# Patient Record
Sex: Male | Born: 1970 | Race: Black or African American | Hispanic: No | Marital: Single | Smoking: Never smoker
Health system: Southern US, Community
[De-identification: ages and names within clinical notes are randomized; demographics above are authoritative.]

## PROBLEM LIST (undated history)

## (undated) DIAGNOSIS — S060XAA Concussion with loss of consciousness status unknown, initial encounter: Secondary | ICD-10-CM

## (undated) DIAGNOSIS — S060X9A Concussion with loss of consciousness of unspecified duration, initial encounter: Secondary | ICD-10-CM

## (undated) DIAGNOSIS — I10 Essential (primary) hypertension: Secondary | ICD-10-CM

## (undated) HISTORY — PX: FINGER SURGERY: SHX640

---

## 2015-12-16 ENCOUNTER — Emergency Department
Admission: EM | Admit: 2015-12-16 | Discharge: 2015-12-16 | Disposition: A | Discharge status: Home / Self Care | Payer: Self-pay | Attending: Student | Admitting: Student

## 2015-12-16 ENCOUNTER — Encounter: Payer: Self-pay | Admitting: Emergency Medicine

## 2015-12-16 ENCOUNTER — Emergency Department: Payer: Self-pay

## 2015-12-16 DIAGNOSIS — I10 Essential (primary) hypertension: Secondary | ICD-10-CM | POA: Insufficient documentation

## 2015-12-16 DIAGNOSIS — S0292XA Unspecified fracture of facial bones, initial encounter for closed fracture: Secondary | ICD-10-CM

## 2015-12-16 DIAGNOSIS — S0232XA Fracture of orbital floor, left side, initial encounter for closed fracture: Secondary | ICD-10-CM | POA: Insufficient documentation

## 2015-12-16 DIAGNOSIS — Y999 Unspecified external cause status: Secondary | ICD-10-CM | POA: Insufficient documentation

## 2015-12-16 DIAGNOSIS — Y929 Unspecified place or not applicable: Secondary | ICD-10-CM | POA: Insufficient documentation

## 2015-12-16 DIAGNOSIS — Y939 Activity, unspecified: Secondary | ICD-10-CM | POA: Insufficient documentation

## 2015-12-16 DIAGNOSIS — S0240FA Zygomatic fracture, left side, initial encounter for closed fracture: Secondary | ICD-10-CM | POA: Insufficient documentation

## 2015-12-16 DIAGNOSIS — S0990XA Unspecified injury of head, initial encounter: Secondary | ICD-10-CM

## 2015-12-16 DIAGNOSIS — S0240DA Maxillary fracture, left side, initial encounter for closed fracture: Secondary | ICD-10-CM | POA: Insufficient documentation

## 2015-12-16 DIAGNOSIS — T1490XA Injury, unspecified, initial encounter: Secondary | ICD-10-CM

## 2015-12-16 HISTORY — DX: Essential (primary) hypertension: I10

## 2015-12-16 HISTORY — DX: Concussion with loss of consciousness status unknown, initial encounter: S06.0XAA

## 2015-12-16 HISTORY — DX: Concussion with loss of consciousness of unspecified duration, initial encounter: S06.0X9A

## 2015-12-16 LAB — COMPREHENSIVE METABOLIC PANEL
ALBUMIN: 4.7 g/dL (ref 3.5–5.0)
ALK PHOS: 62 U/L (ref 38–126)
ALT: 19 U/L (ref 17–63)
ANION GAP: 7 (ref 5–15)
AST: 34 U/L (ref 15–41)
BUN: 17 mg/dL (ref 6–20)
CALCIUM: 9.7 mg/dL (ref 8.9–10.3)
CO2: 28 mmol/L (ref 22–32)
Chloride: 104 mmol/L (ref 101–111)
Creatinine, Ser: 1.31 mg/dL — ABNORMAL HIGH (ref 0.61–1.24)
GFR calc Af Amer: >60 mL/min (ref >60)
GFR calc non Af Amer: >60 mL/min (ref >60)
GLUCOSE: 138 mg/dL — AB (ref 65–99)
Potassium: 3.5 mmol/L (ref 3.5–5.1)
SODIUM: 139 mmol/L (ref 135–145)
Total Bilirubin: 1.2 mg/dL (ref 0.3–1.2)
Total Protein: 7.8 g/dL (ref 6.5–8.1)

## 2015-12-16 LAB — CBC WITH DIFFERENTIAL/PLATELET
BASOS ABS: 0.1 10*3/uL (ref 0–0.1)
BASOS PCT: 0 %
EOS ABS: 0.1 10*3/uL (ref 0–0.7)
Eosinophils Relative: 1 %
HEMATOCRIT: 45 % (ref 40.0–52.0)
HEMOGLOBIN: 14.8 g/dL (ref 13.0–18.0)
Lymphocytes Relative: 7 %
Lymphs Abs: 1.9 10*3/uL (ref 1.0–3.6)
MCH: 29.5 pg (ref 26.0–34.0)
MCHC: 32.8 g/dL (ref 32.0–36.0)
MCV: 89.9 fL (ref 80.0–100.0)
Monocytes Absolute: 2 10*3/uL — ABNORMAL HIGH (ref 0.2–1.0)
Monocytes Relative: 8 %
NEUTROS ABS: 22.6 10*3/uL — AB (ref 1.4–6.5)
NEUTROS PCT: 84 %
Platelets: 175 10*3/uL (ref 150–440)
RBC: 5.01 MIL/uL (ref 4.40–5.90)
RDW: 13.5 % (ref 11.5–14.5)
WBC: 26.7 10*3/uL — AB (ref 3.8–10.6)

## 2015-12-16 LAB — PROTIME-INR
INR: 1.14
Prothrombin Time: 14.8 seconds (ref 11.4–15.0)

## 2015-12-16 LAB — APTT: APTT: 24 s (ref 24–36)

## 2015-12-16 LAB — LIPASE, BLOOD: Lipase: 20 U/L (ref 11–51)

## 2015-12-16 MED ORDER — ONDANSETRON HCL 4 MG/2ML IJ SOLN
INTRAMUSCULAR | Status: AC
  Filled 2015-12-16: qty 2

## 2015-12-16 MED ORDER — ONDANSETRON HCL 4 MG/2ML IJ SOLN
4.0000 mg | Freq: Once | INTRAMUSCULAR | Status: AC
  Administered 2015-12-16: 4 mg via INTRAVENOUS
  Filled 2015-12-16: qty 2

## 2015-12-16 MED ORDER — ONDANSETRON HCL 4 MG/2ML IJ SOLN
4.0000 mg | Freq: Once | INTRAMUSCULAR | Status: AC
  Administered 2015-12-16: 4 mg via INTRAVENOUS

## 2015-12-16 MED ORDER — OXYCODONE HCL 5 MG PO TABS: 5.0000 mg | ORAL_TABLET | Freq: Four times a day (QID) | ORAL | Status: DC | PRN

## 2015-12-16 MED ORDER — MORPHINE SULFATE (PF) 4 MG/ML IV SOLN
4.0000 mg | Freq: Once | INTRAVENOUS | Status: AC
  Administered 2015-12-16: 4 mg via INTRAVENOUS
  Filled 2015-12-16: qty 1

## 2015-12-16 MED ORDER — ONDANSETRON 4 MG PO TBDP
4.0000 mg | ORAL_TABLET | Freq: Three times a day (TID) | ORAL | Status: AC | PRN
Start: 2015-12-16 — End: ?

## 2015-12-16 MED ORDER — SODIUM CHLORIDE 0.9 % IV BOLUS (SEPSIS)
1000.0000 mL | Freq: Once | INTRAVENOUS | Status: AC
  Administered 2015-12-16: 1000 mL via INTRAVENOUS

## 2015-12-16 NOTE — ED Notes (Signed)
Discharge instructions reviewed with patient. Patient verbalized understanding. Patient ambulated to lobby without difficulty.   

## 2015-12-16 NOTE — ED Notes (Signed)
Pt standing up next to stretcher. Explained to pt that he needed to use call bell if he needed to get up and to not get up without assistance. Pt verbalized understanding. Pt alert and oriented times 4 at this time.

## 2015-12-16 NOTE — ED Notes (Signed)
Went in to ask pt what he would like to drink for PO challenge. Pt stated, "Nothing, I'm not thirsty." Explained to pt that we needed to give him something to drink to make sure that he would be able to keep it down and not vomit it back up so that we could send him home. Pt stated, "I don't want to go home. I need to stay at least one night here." Provider notified and to bedside to talk with patient and family.
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
## 2015-12-16 NOTE — ED Notes (Signed)
Patient transported to CT 
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
## 2015-12-16 NOTE — ED Notes (Signed)
Pt advised to ask for assistance before getting up. Pt crawled out of bed stating he had to have a bowel movement. Pt noted with unsteady gait. Pt vomited in toilet. Pt assisted back to bed and Dr. Inocencio HomesGayle at bedside.

## 2019-09-20 ENCOUNTER — Emergency Department: Payer: Self-pay

## 2019-09-20 ENCOUNTER — Other Ambulatory Visit: Payer: Self-pay

## 2019-09-20 ENCOUNTER — Encounter: Payer: Self-pay | Admitting: *Deleted

## 2019-09-20 DIAGNOSIS — F1210 Cannabis abuse, uncomplicated: Secondary | ICD-10-CM | POA: Insufficient documentation

## 2019-09-20 DIAGNOSIS — I10 Essential (primary) hypertension: Secondary | ICD-10-CM | POA: Insufficient documentation

## 2019-09-20 DIAGNOSIS — R109 Unspecified abdominal pain: Secondary | ICD-10-CM | POA: Insufficient documentation

## 2019-09-20 LAB — URINALYSIS, COMPLETE (UACMP) WITH MICROSCOPIC
Bacteria, UA: NONE SEEN
Bilirubin Urine: NEGATIVE
Glucose, UA: NEGATIVE mg/dL
Ketones, ur: NEGATIVE mg/dL
Leukocytes,Ua: NEGATIVE
Nitrite: NEGATIVE
Protein, ur: NEGATIVE mg/dL
Specific Gravity, Urine: 1.038 — ABNORMAL HIGH (ref 1.005–1.030)
Squamous Epithelial / LPF: NONE SEEN (ref 0–5)
pH: 5 (ref 5.0–8.0)

## 2019-09-20 NOTE — ED Triage Notes (Signed)
Pt presents w/ c/o flank pain. Pt states tingling near spine and pain that developed over the course of 3 days. Pt states pain was getting progressively worse. Pt took a couple of muscle relaxing meds, vomiting afterwards. Pt has been taking tylenol 650 mg for pain, last taken at 1600 today.

## 2019-09-21 ENCOUNTER — Emergency Department
Admission: EM | Admit: 2019-09-21 | Discharge: 2019-09-21 | Disposition: A | Discharge status: Home / Self Care | Payer: Self-pay | Attending: Emergency Medicine | Admitting: Emergency Medicine

## 2019-09-21 DIAGNOSIS — R109 Unspecified abdominal pain: Secondary | ICD-10-CM

## 2019-09-21 LAB — CBC WITH DIFFERENTIAL/PLATELET
Abs Immature Granulocytes: 0.01 10*3/uL (ref 0.00–0.07)
Basophils Absolute: 0.1 10*3/uL (ref 0.0–0.1)
Basophils Relative: 1 %
Eosinophils Absolute: 0.5 10*3/uL (ref 0.0–0.5)
Eosinophils Relative: 7 %
HCT: 44.9 % (ref 39.0–52.0)
Hemoglobin: 14.7 g/dL (ref 13.0–17.0)
Immature Granulocytes: 0 %
Lymphocytes Relative: 52 %
Lymphs Abs: 3.8 10*3/uL (ref 0.7–4.0)
MCH: 30.6 pg (ref 26.0–34.0)
MCHC: 32.7 g/dL (ref 30.0–36.0)
MCV: 93.5 fL (ref 80.0–100.0)
Monocytes Absolute: 0.6 10*3/uL (ref 0.1–1.0)
Monocytes Relative: 8 %
Neutro Abs: 2.3 10*3/uL (ref 1.7–7.7)
Neutrophils Relative %: 32 %
Platelets: 174 10*3/uL (ref 150–400)
RBC: 4.8 MIL/uL (ref 4.22–5.81)
RDW: 12.1 % (ref 11.5–15.5)
WBC: 7.2 10*3/uL (ref 4.0–10.5)
nRBC: 0 % (ref 0.0–0.2)

## 2019-09-21 LAB — COMPREHENSIVE METABOLIC PANEL
ALT: 18 U/L (ref 0–44)
AST: 31 U/L (ref 15–41)
Albumin: 4.3 g/dL (ref 3.5–5.0)
Alkaline Phosphatase: 59 U/L (ref 38–126)
Anion gap: 10 (ref 5–15)
BUN: 13 mg/dL (ref 6–20)
CO2: 22 mmol/L (ref 22–32)
Calcium: 9.5 mg/dL (ref 8.9–10.3)
Chloride: 107 mmol/L (ref 98–111)
Creatinine, Ser: 1.06 mg/dL (ref 0.61–1.24)
GFR calc Af Amer: >60 mL/min (ref >60)
GFR calc non Af Amer: >60 mL/min (ref >60)
Glucose, Bld: 82 mg/dL (ref 70–99)
Potassium: 4.4 mmol/L (ref 3.5–5.1)
Sodium: 139 mmol/L (ref 135–145)
Total Bilirubin: 1 mg/dL (ref 0.3–1.2)
Total Protein: 7.2 g/dL (ref 6.5–8.1)

## 2019-09-21 LAB — LIPASE, BLOOD: Lipase: 29 U/L (ref 11–51)

## 2019-09-21 MED ORDER — KETOROLAC TROMETHAMINE 30 MG/ML IJ SOLN
10.0000 mg | Freq: Once | INTRAMUSCULAR | Status: AC
  Administered 2019-09-21: 9.9 mg via INTRAVENOUS
  Filled 2019-09-21: qty 1

## 2019-09-21 MED ORDER — OXYCODONE-ACETAMINOPHEN 5-325 MG PO TABS: 1.0000 | ORAL_TABLET | ORAL | 0 refills | Status: AC | PRN

## 2019-09-21 MED ORDER — ACETAMINOPHEN 325 MG PO TABS
650.0000 mg | ORAL_TABLET | Freq: Once | ORAL | Status: AC
  Administered 2019-09-21: 650 mg via ORAL
  Filled 2019-09-21: qty 2

## 2019-09-21 MED ORDER — SODIUM CHLORIDE 0.9 % IV BOLUS
1000.0000 mL | Freq: Once | INTRAVENOUS | Status: AC
  Administered 2019-09-21: 1000 mL via INTRAVENOUS

## 2019-09-21 MED ORDER — OXYCODONE-ACETAMINOPHEN 5-325 MG PO TABS
1.0000 | ORAL_TABLET | Freq: Once | ORAL | Status: AC
  Administered 2019-09-21: 03:00:00 1 via ORAL
  Filled 2019-09-21: qty 1

## 2019-09-21 NOTE — Discharge Instructions (Addendum)
1.  You may continue ibuprofen as needed for pain; Percocet as needed for more severe pain. 2.  Drink plenty of fluids daily. 3.  Return to the ER for worsening symptoms, persistent vomiting, difficulty breathing or other concerns.

## 2019-09-21 NOTE — ED Provider Notes (Signed)
[facility] Emergency Department Provider Note   ____________________________________________   First MD Initiated Contact with Patient 09/21/19 0129     (approximate)  I have reviewed the triage vital signs and the nursing notes.   HISTORY  Chief Complaint Flank Pain    HPI Travis Huffman is a 48 y.o. male who presents to the ED from home with a chief complaint of nontraumatic right flank pain.  Patient reports a 3-day history of pain in his right flank that has now radiated into his right side.  Patient took a friend's muscle relaxer pill x2 and vomited afterwards.  Has been taking Tylenol and ibuprofen for the pain without relief of symptoms.  Denies fever, cough, chest pain, shortness of breath, abdominal pain, hematuria, dysuria.  Denies recent trauma.  Denies exertional workouts.       Past Medical History:  Diagnosis Date  . Concussion   . Hypertension     There are no problems to display for this patient.   Past Surgical History:  Procedure Laterality Date  . FINGER SURGERY      Prior to Admission medications   Medication Sig Start Date End Date Taking? Authorizing Provider  ondansetron (ZOFRAN ODT) 4 MG disintegrating tablet Take 1 tablet (4 mg total) by mouth every 8 (eight) hours as needed for nausea or vomiting. 12/16/15   Joanne Gavel, MD  oxyCODONE (ROXICODONE) 5 MG immediate release tablet Take 1 tablet (5 mg total) by mouth every 6 (six) hours as needed for moderate pain. Do not drive while taking this medication. 12/16/15   Joanne Gavel, MD    Allergies Penicillins  History reviewed. No pertinent family history.  Social History Social History   Tobacco Use  . Smoking status: Never Smoker  . Smokeless tobacco: Never Used  Substance Use Topics  . Alcohol use: Yes    Comment: pt states drinks but does not know how much  . Drug use: Yes    Types: Marijuana    Comment: daily    Review of  Systems  Constitutional: No fever/chills Eyes: No visual changes. ENT: No sore throat. Cardiovascular: Denies chest pain. Respiratory: Denies shortness of breath. Gastrointestinal: Positive for right flank pain.  No abdominal pain.  No nausea, no vomiting.  No diarrhea.  No constipation. Genitourinary: Negative for dysuria. Musculoskeletal: Negative for back pain. Skin: Negative for rash. Neurological: Negative for headaches, focal weakness or numbness.   ____________________________________________   PHYSICAL EXAM:  VITAL SIGNS: ED Triage Vitals  Enc Vitals Group     BP 09/20/19 1944 (!) 148/98     Pulse Rate 09/20/19 1944 68     Resp 09/20/19 1944 17     Temp 09/20/19 1944 98.9 F (37.2 C)     Temp Source 09/20/19 1944 Oral     SpO2 09/20/19 1944 97 %     Weight 09/20/19 1944 170 lb (77.1 kg)     Height 09/20/19 1944 6' (1.829 m)     Head Circumference --      Peak Flow --      Pain Score 09/20/19 1954 0     Pain Loc --      Pain Edu? --      Excl. in [place]? --     Constitutional: Alert and oriented. Well appearing and in no acute distress. Eyes: Conjunctivae are normal. PERRL. EOMI. Head: Atraumatic. Nose: No congestion/rhinnorhea. Mouth/Throat: Mucous membranes are moist.  Oropharynx non-erythematous. Neck: No stridor.  Cardiovascular: Normal rate, regular rhythm. Grossly normal heart sounds.  Good peripheral circulation. Respiratory: Normal respiratory effort.  No retractions. Lungs CTAB. Gastrointestinal: Soft and nontender to light or deep palpation. No distention. No abdominal bruits. No CVA tenderness. Musculoskeletal: No spinal tenderness to palpation.  No lower extremity tenderness nor edema.  No joint effusions. Neurologic:  Normal speech and language. No gross focal neurologic deficits are appreciated. No gait instability. Skin:  Skin is warm, dry and intact. No rash noted.  No vesicles. Psychiatric: Mood and affect are normal. Speech and behavior are  normal.  ____________________________________________   LABS (all labs ordered are listed, but only abnormal results are displayed)  Labs Reviewed  URINALYSIS, COMPLETE (UACMP) WITH MICROSCOPIC - Abnormal; Notable for the following components:      Result Value   Color, Urine YELLOW (*)    APPearance CLEAR (*)    Specific Gravity, Urine 1.038 (*)    Hgb urine dipstick SMALL (*)    All other components within normal limits  CBC WITH DIFFERENTIAL/PLATELET  COMPREHENSIVE METABOLIC PANEL  LIPASE, BLOOD   ____________________________________________  EKG  None ____________________________________________  RADIOLOGY  ED MD interpretation: Tiny nonobstructing right renal calculus  Official radiology report(s): CT RENAL STONE STUDY  Result Date: 09/20/2019 CLINICAL DATA:  Progressive flank pain for 3 days. EXAM: CT ABDOMEN AND PELVIS WITHOUT CONTRAST TECHNIQUE: Multidetector CT imaging of the abdomen and pelvis was performed following the standard protocol without IV contrast. COMPARISON:  None. FINDINGS: Lower chest: Clear lung bases. No significant pleural or pericardial effusion. Hepatobiliary: There is a 10 mm cyst in the left hepatic lobe on image 17/2. No other focal hepatic abnormalities are identified on noncontrast imaging. No evidence of gallstones, gallbladder wall thickening or biliary dilatation. Pancreas: Unremarkable. No pancreatic ductal dilatation or surrounding inflammatory changes. Spleen: Normal in size without focal abnormality. Adrenals/Urinary Tract: Both adrenal glands appear normal. There is a 2 mm nonobstructing calculus in the upper pole of the right kidney, best seen on coronal image 82/5. No other urinary tract calculi are seen. There is no hydronephrosis or perinephric soft tissue stranding. The bladder is nearly empty and suboptimally evaluated. Stomach/Bowel: No evidence of bowel wall thickening, distention or surrounding inflammatory change. The appendix  appears normal. Vascular/Lymphatic: There are no enlarged abdominal or pelvic lymph nodes. Mild aortic atherosclerosis. Reproductive: The prostate gland and seminal vesicles appear normal. Other: No evidence of abdominal wall mass or hernia. No ascites. Musculoskeletal: No acute or significant osseous findings. Mild lower lumbar spondylosis. IMPRESSION: 1. No acute findings or explanation for the patient's symptoms. 2. Tiny nonobstructing right renal calculus. No evidence of ureteral calculus or hydronephrosis. 3. Aortic Atherosclerosis (ICD10-I70.0). Electronically Signed   By: Carey Bullocks M.D.   On: 09/20/2019 20:35    ____________________________________________   PROCEDURES  Procedure(s) performed (including Critical Care):  Procedures   ____________________________________________   INITIAL IMPRESSION / ASSESSMENT AND PLAN / ED COURSE  As part of my medical decision making, I reviewed the following data within the electronic MEDICAL RECORD NUMBER Nursing notes reviewed and incorporated, Labs reviewed, Old chart reviewed, Radiograph reviewed, Notes from prior ED visits and [place] Controlled Substance Database     Travis Huffman was evaluated in Emergency Department on 12/19/2020for the symptoms described in the history of present illness. He was evaluated in the context of the global COVID-19 pandemic, which necessitated consideration that the patient might be at risk for infection with the SARS-CoV-2 virus that causes COVID-19. Institutional protocols and algorithms that  pertain to the evaluation of patients at risk for COVID-19 are in a state of rapid change based on information released by regulatory bodies including the CDC and federal and state organizations. These policies and algorithms were followed during the patient's care in the ED.    48 year old male who presents with a several day history of nontraumatic right flank pain. Differential diagnosis includes, but is not  limited to, acute appendicitis, renal colic, testicular torsion, urinary tract infection/pyelonephritis, prostatitis,  epididymitis, diverticulitis, small bowel obstruction or ileus, colitis, abdominal aortic aneurysm, gastroenteritis, hernia, etc.  Urinalysis revealed microscopic hematuria.  CT renal stone did not demonstrate obvious obstructing ureteral stone.  Will obtain basic lab work, initiate IV fluid hydration, IV Toradol for pain and reassess.   Clinical Course as of Sep 20 253  Sat Sep 21, 2019  [id number] Patient resting in no acute distress.  Updated him on laboratory results.  Likely patient has tiny stone causing his pain.  Will empirically treat with analgesia and patient will follow up with his PCP next week.  Strict return precautions given.  Patient verbalizes understanding agrees with plan of care.   [JS]    Clinical Course User Index [JS] Irean HongSung, Deshia Vanderhoof J, MD     ____________________________________________   FINAL CLINICAL IMPRESSION(S) / ED DIAGNOSES  Final diagnoses:  Right flank pain     ED Discharge Orders    None       Note:  This document was prepared using Dragon voice recognition software and may include unintentional dictation errors.   Irean HongSung, Vallory Oetken J, MD 09/21/19 [phone]

## 2020-06-21 IMAGING — CT CT RENAL STONE PROTOCOL
2 of 4 series · 16 of 46 positions shown, 18 images · non-contrast
Comparison: None.

CLINICAL DATA: Progressive flank pain for 3 days.

EXAM:
CT ABDOMEN AND PELVIS WITHOUT CONTRAST
TECHNIQUE: Multidetector CT imaging of the abdomen and pelvis was performed
following the standard protocol without IV contrast.

[Series 2: stone full standard · axial · 0.74mm/px · z∈[-976,-606]mm · 13 of 82 slices shown, 15 images]
[im 4/82  soft-tissue]
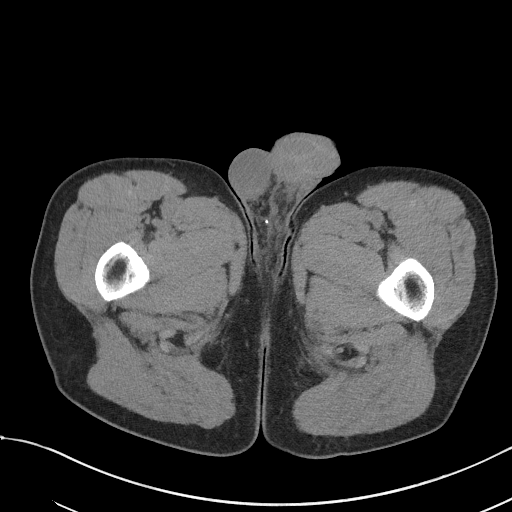
[im 4/82  bone]
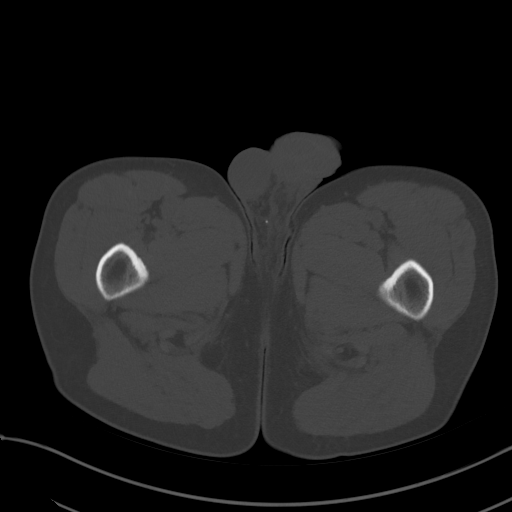
[im 10/82  soft-tissue]
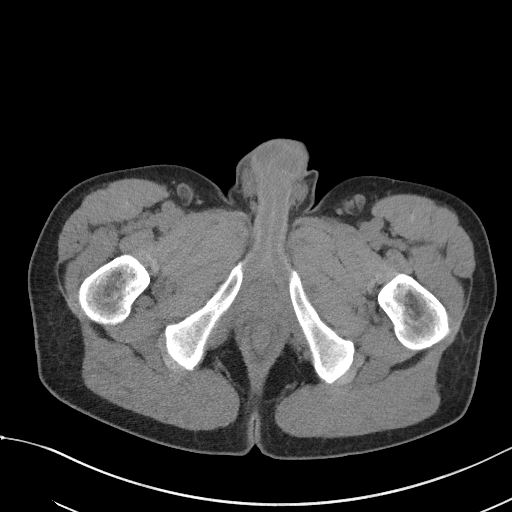
[im 16/82  soft-tissue]
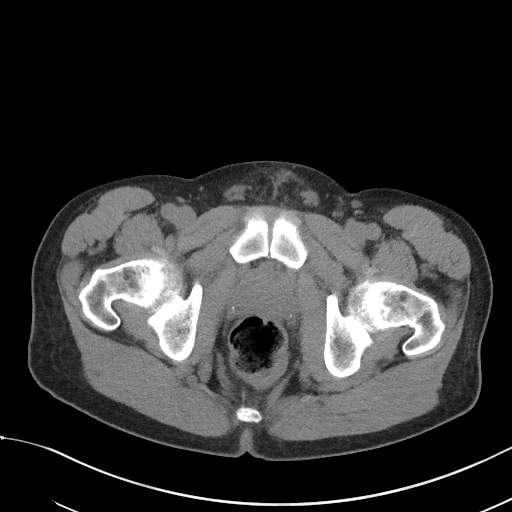
[im 22/82  soft-tissue]
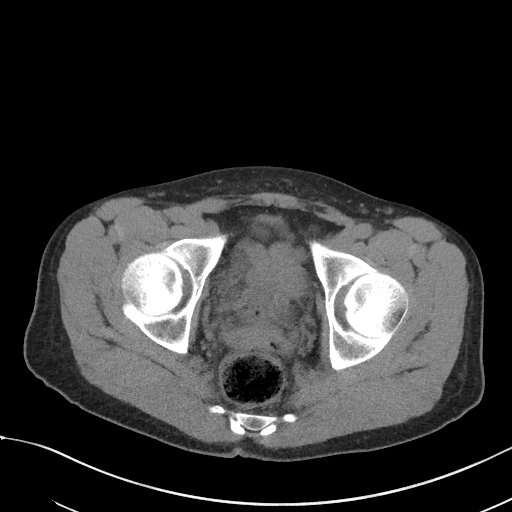
[im 29/82  soft-tissue]
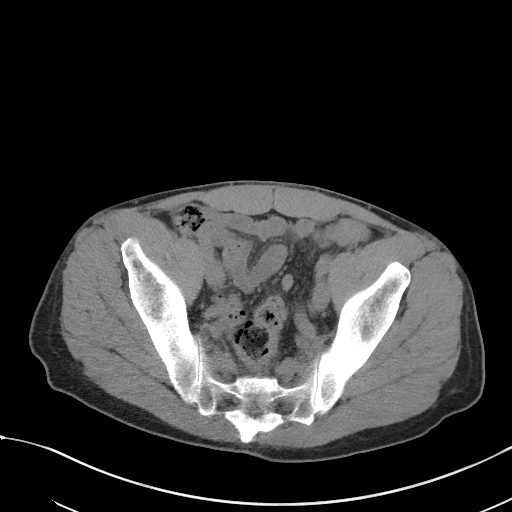
[im 35/82  soft-tissue]
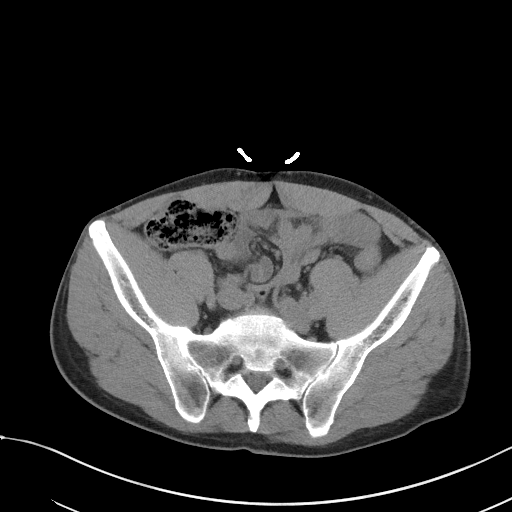
[im 41/82  soft-tissue]
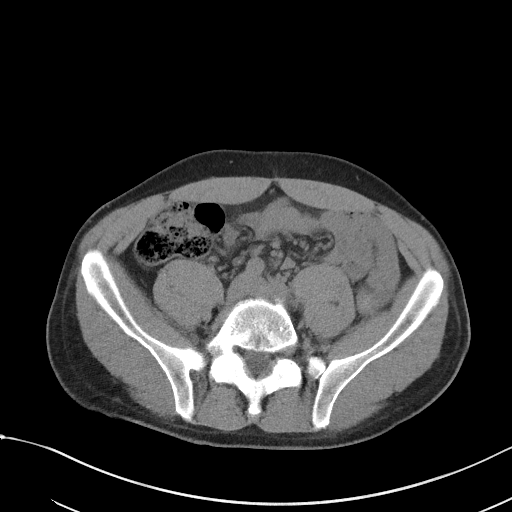
[im 47/82  soft-tissue]
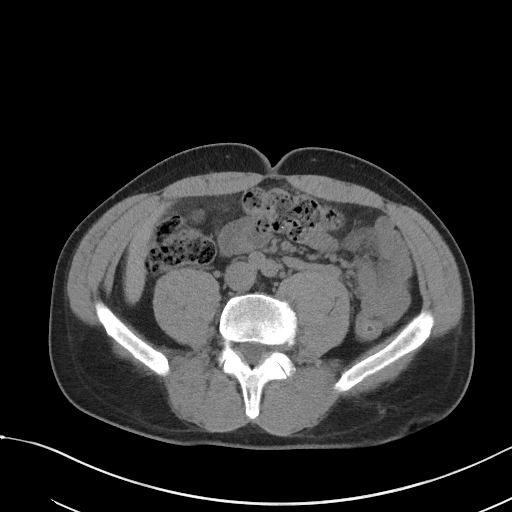
[im 53/82  soft-tissue]
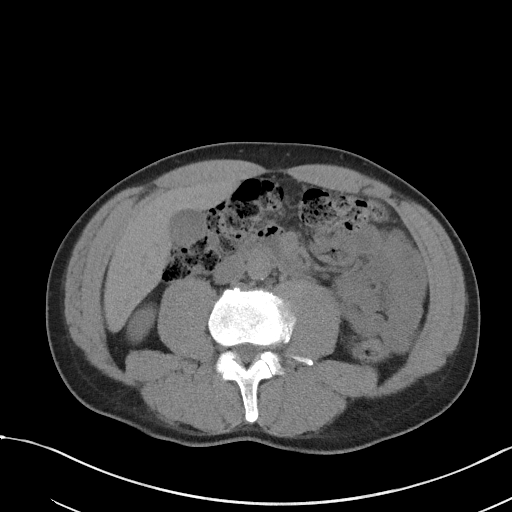
[im 53/82  bone]
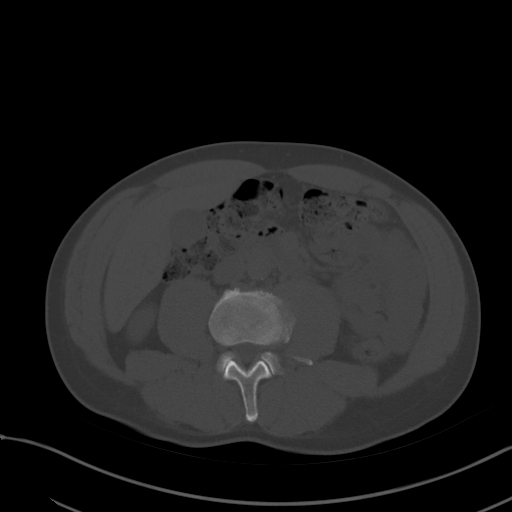
[im 60/82  soft-tissue]
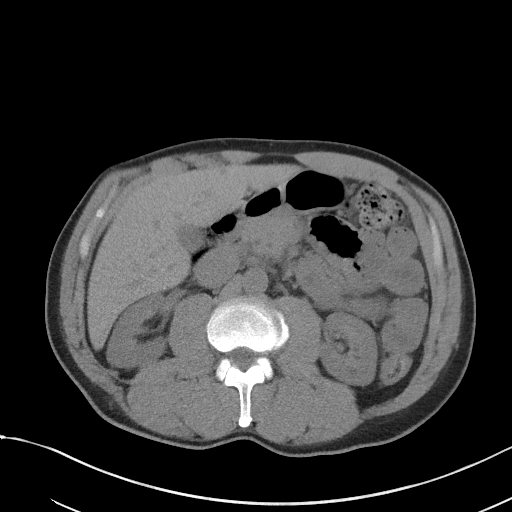
[im 66/82  soft-tissue]
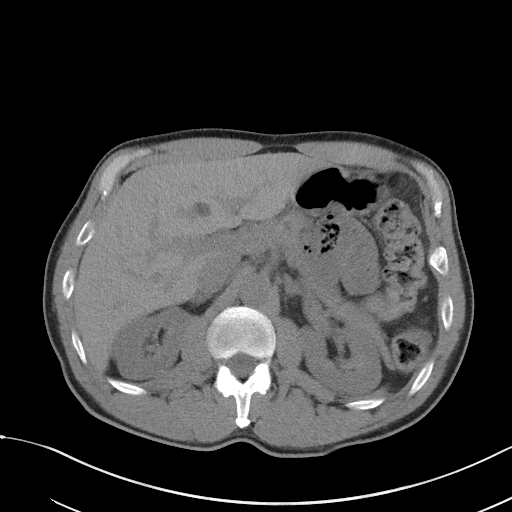
[im 72/82  soft-tissue]
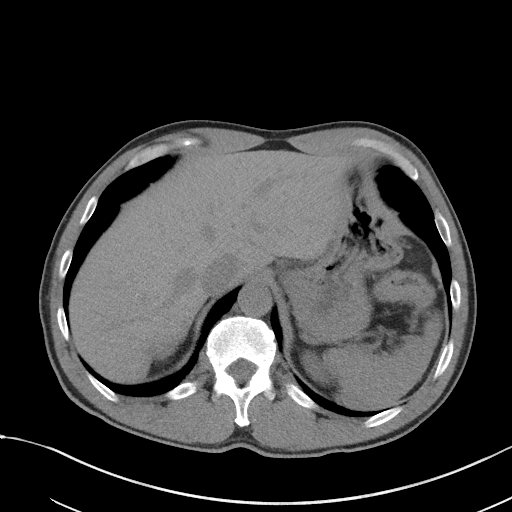
[im 78/82  soft-tissue]
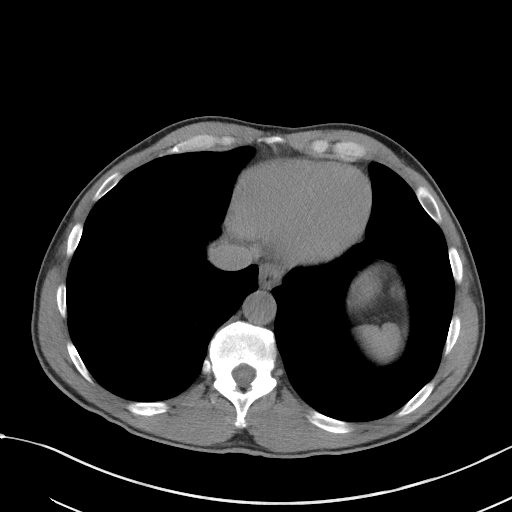

[Series 5: coronal · coronal · 0.70mm/px · 3 of 121 slices shown]
[im 41/121  soft-tissue]
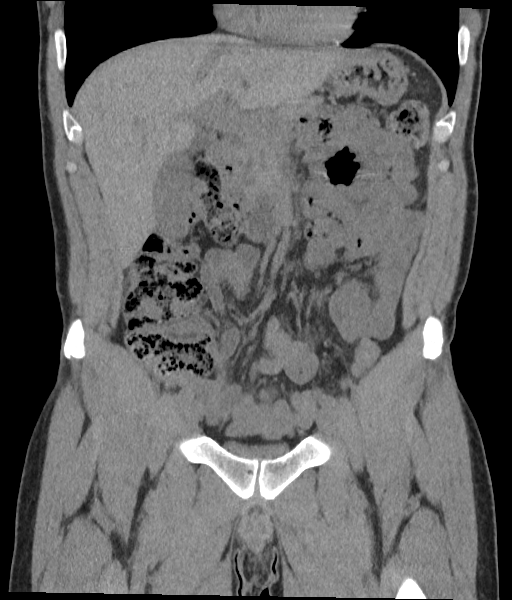
[im 54/121  soft-tissue]
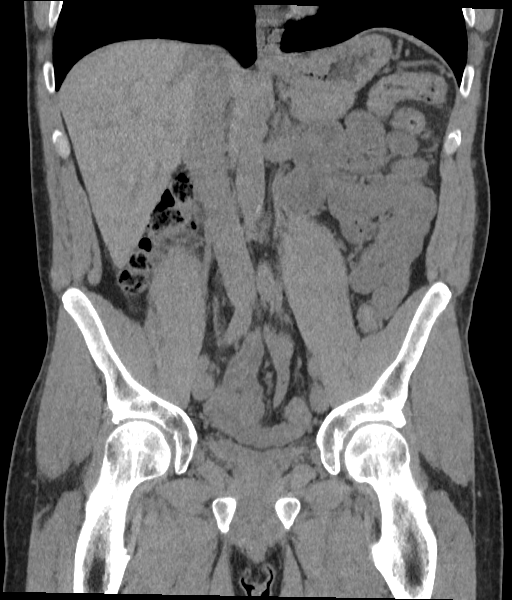
[im 67/121  soft-tissue]
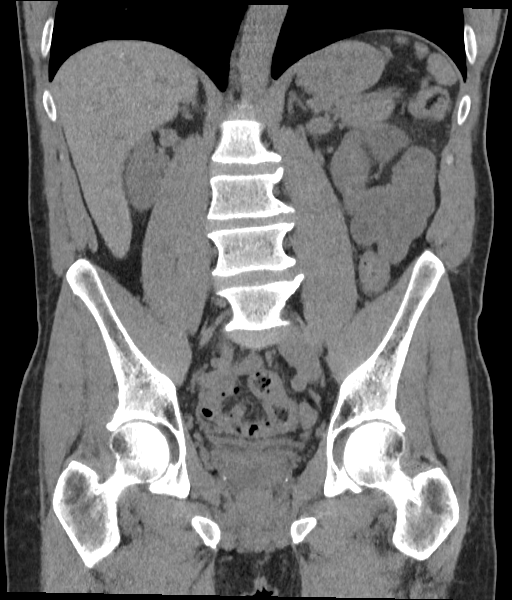

[16 of 46 positions shown; findings below may reference images not displayed]

FINDINGS: Lower chest: Clear lung bases. No significant pleural or pericardial
effusion.

Hepatobiliary: There is a 10 mm cyst in the left hepatic lobe on
image [DATE]. No other focal hepatic abnormalities are identified on
noncontrast imaging. No evidence of gallstones, gallbladder wall
thickening or biliary dilatation.

Pancreas: Unremarkable. No pancreatic ductal dilatation or
surrounding inflammatory changes.

Spleen: Normal in size without focal abnormality.

Adrenals/Urinary Tract: Both adrenal glands appear normal. There is
a 2 mm nonobstructing calculus in the upper pole of the right
kidney, best seen on coronal image 82/5. No other urinary tract
calculi are seen. There is no hydronephrosis or perinephric soft
tissue stranding. The bladder is nearly empty and suboptimally
evaluated.

Stomach/Bowel: No evidence of bowel wall thickening, distention or
surrounding inflammatory change. The appendix appears normal.

Vascular/Lymphatic: There are no enlarged abdominal or pelvic lymph
nodes. Mild aortic atherosclerosis.

Reproductive: The prostate gland and seminal vesicles appear normal.

Other: No evidence of abdominal wall mass or hernia. No ascites.

Musculoskeletal: No acute or significant osseous findings. Mild
lower lumbar spondylosis.
IMPRESSION: 1. No acute findings or explanation for the patient's symptoms.
2. Tiny nonobstructing right renal calculus. No evidence of ureteral
calculus or hydronephrosis.
3. Aortic Atherosclerosis ([id number]).
